# Patient Record
Sex: Male | Born: 1985 | Race: Black or African American | Hispanic: No | Marital: Married | Smoking: Never smoker
Health system: Southern US, Community
[De-identification: ages and names within clinical notes are randomized; demographics above are authoritative.]

## PROBLEM LIST (undated history)

## (undated) DIAGNOSIS — E119 Type 2 diabetes mellitus without complications: Secondary | ICD-10-CM

## (undated) DIAGNOSIS — G4730 Sleep apnea, unspecified: Secondary | ICD-10-CM

## (undated) DIAGNOSIS — E785 Hyperlipidemia, unspecified: Secondary | ICD-10-CM

## (undated) HISTORY — DX: Type 2 diabetes mellitus without complications: E11.9

## (undated) HISTORY — DX: Sleep apnea, unspecified: G47.30

## (undated) HISTORY — DX: Hyperlipidemia, unspecified: E78.5

---

## 2014-09-12 HISTORY — PX: ANKLE FRACTURE SURGERY: SHX122

## 2017-03-04 ENCOUNTER — Encounter (HOSPITAL_COMMUNITY): Payer: Self-pay | Admitting: *Deleted

## 2017-03-04 ENCOUNTER — Emergency Department (HOSPITAL_COMMUNITY): Payer: Self-pay

## 2017-03-04 ENCOUNTER — Emergency Department (HOSPITAL_COMMUNITY)
Admission: EM | Admit: 2017-03-04 | Discharge: 2017-03-04 | Disposition: A | Discharge status: Home / Self Care | Payer: Self-pay | Attending: Emergency Medicine | Admitting: Emergency Medicine

## 2017-03-04 DIAGNOSIS — Y990 Civilian activity done for income or pay: Secondary | ICD-10-CM | POA: Insufficient documentation

## 2017-03-04 DIAGNOSIS — S62342A Nondisplaced fracture of base of third metacarpal bone, right hand, initial encounter for closed fracture: Secondary | ICD-10-CM | POA: Insufficient documentation

## 2017-03-04 DIAGNOSIS — Y9389 Activity, other specified: Secondary | ICD-10-CM | POA: Insufficient documentation

## 2017-03-04 DIAGNOSIS — Y9229 Other specified public building as the place of occurrence of the external cause: Secondary | ICD-10-CM | POA: Insufficient documentation

## 2017-03-04 MED ORDER — HYDROCODONE-ACETAMINOPHEN 5-325 MG PO TABS: 1.0000 | ORAL_TABLET | Freq: Four times a day (QID) | ORAL | 0 refills | Status: DC | PRN
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
## 2017-03-04 NOTE — Progress Notes (Signed)
Orthopedic Tech Progress Note Patient Details:  Brian Dickson 06/11/1986 [id number]  Ortho Devices Type of Ortho Device: Ace wrap, Volar splint Ortho Device/Splint Location: RUE Ortho Device/Splint Interventions: Ordered, Application   Jennye MoccasinHughes, [name] Craig 03/04/2017, 4:53 PM
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
## 2017-03-04 NOTE — Discharge Instructions (Signed)
Norco as needed for pain. Ice affected area (see instructions below).  Please call the orthopedic physician listed today or first thing in the morning to schedule a follow up appointment.   Fractures generally take 4-6 weeks to heal. It is very important to keep your splint dry until your follow up with the orthopedic doctor and a cast can be applied. You may place a plastic bag around the extremity with the splint while bathing to keep it dry. Also try to sleep with the extremity elevated for the next several nights to decrease swelling. Check the fingertips and toes several times per day to make sure they are not cold, pale, or blue. If this is the case, the splint may be too tight and should return to the ER, your regular doctor or the orthopedist for recheck. Return to the ER for new or worsening symptoms, any additional concerns.   COLD THERAPY DIRECTIONS:  Ice or gel packs can be used to reduce both pain and swelling. Ice is the most helpful within the first 24 to 48 hours after an injury or flareup from overusing a muscle or joint.  Ice is effective, has very few side effects, and is safe for most people to use.   If you expose your skin to cold temperatures for too long or without the proper protection, you can damage your skin or nerves. Watch for signs of skin damage due to cold.   HOME CARE INSTRUCTIONS  Follow these tips to use ice and cold packs safely.  Place a dry or damp towel between the ice and skin. A damp towel will cool the skin more quickly, so you may need to shorten the time that the ice is used.  For a more rapid response, add gentle compression to the ice.  Ice for no more than 10 to 20 minutes at a time. The bonier the area you are icing, the less time it will take to get the benefits of ice.  Check your skin after 5 minutes to make sure there are no signs of a poor response to cold or skin damage.  Rest 20 minutes or more in between uses.  Once your skin is numb, you can  end your treatment. You can test numbness by very lightly touching your skin. The touch should be so light that you do not see the skin dimple from the pressure of your fingertip. When using ice, most people will feel these normal sensations in this order: cold, burning, aching, and numbness.

## 2017-03-08 DIAGNOSIS — M25649 Stiffness of unspecified hand, not elsewhere classified: Secondary | ICD-10-CM | POA: Insufficient documentation

## 2017-03-08 DIAGNOSIS — M79644 Pain in right finger(s): Secondary | ICD-10-CM | POA: Insufficient documentation

## 2018-01-19 ENCOUNTER — Ambulatory Visit: Payer: BLUE CROSS/BLUE SHIELD | Admitting: Medical

## 2018-01-19 ENCOUNTER — Encounter: Payer: Self-pay | Admitting: Medical

## 2018-01-19 VITALS — BP 130/86 | HR 81 | Temp 98.1°F | Ht 69.5 in | Wt 263.4 lb

## 2018-01-19 DIAGNOSIS — E669 Obesity, unspecified: Secondary | ICD-10-CM | POA: Diagnosis not present

## 2018-01-19 DIAGNOSIS — R0683 Snoring: Secondary | ICD-10-CM | POA: Diagnosis not present

## 2018-01-19 NOTE — Progress Notes (Signed)
Subjective: Chief Complaint  Patient presents with  . New Patient (Initial Visit)  . Snoring    GET LOUDER OVER THE YEARS   Here as a new patient.   Wife wanted him to get checked out.    Wife says he snores loud and he has a recording with him that verifies loud snoring with a pause in the middle for about 5 seconds.    For the most part awakes rested.  No specific daytime somnolence.  No witnessed apnea.      Exercise - sometimes.  Drives tow truck.    No family hx/o sleep apnea.    No past medical history on file.   No current outpatient medications on file prior to visit.   No current facility-administered medications on file prior to visit.    Family History  Problem Relation Age of Onset  . Heart disease Mother        WPW  . Heart disease Maternal Grandmother    ROS as in subjective    Objective: BP 130/86   Pulse 81   Temp 98.1 F (36.7 C) (Oral)   Ht 5' 9.5" (1.765 m)   Wt 263 lb 6.4 oz (119.5 kg)   SpO2 97%   BMI 38.34 kg/m   General appearance: alert, no distress, WD/WN, obese AA male HEENT: normocephalic, sclerae anicteric, TMs pearly, nares patent, no discharge or erythema, pharynx with 2 + tonsils, somewhat small airway Oral cavity: MMM, no lesions Neck: supple, no lymphadenopathy, no thyromegaly, no masses Heart: RRR, normal S1, S2, no murmurs Lungs: CTA bilaterally, no wheezes, rhonchi, or rales Pulses: 2+ symmetric, upper and lower extremities, normal cap refill Ext: no edema   Assessment: Encounter Diagnoses  Name Primary?  . Loud snoring Yes  . Class 2 obesity in adult, unspecified BMI, unspecified obesity type, unspecified whether serious comorbidity present      Plan: Discuss symptoms. Counseled on diet, exercise, measures to lose weight.   Consider sleep study.  He will consult with wife on doing sleep study.    Brian Dickson was seen today for new patient (initial visit) and snoring.  Diagnoses and all orders for this visit:  Loud  snoring  Class 2 obesity in adult, unspecified BMI, unspecified obesity type, unspecified whether serious comorbidity present

## 2018-02-02 ENCOUNTER — Other Ambulatory Visit: Payer: Self-pay

## 2018-02-02 ENCOUNTER — Telehealth: Payer: Self-pay | Admitting: Medical

## 2018-02-02 DIAGNOSIS — R0683 Snoring: Secondary | ICD-10-CM

## 2018-02-02 NOTE — Telephone Encounter (Signed)
Please refer to [facility] for sleep study.

## 2018-02-02 NOTE — Telephone Encounter (Signed)
pts wife called and states that the insurance will cover the sleep study that was requested if that could be set up he can be reached at [phone]

## 2018-02-02 NOTE — Telephone Encounter (Signed)
Sleep study ordered

## 2018-02-23 ENCOUNTER — Encounter: Payer: Self-pay | Admitting: Medical

## 2018-03-20 ENCOUNTER — Ambulatory Visit (HOSPITAL_BASED_OUTPATIENT_CLINIC_OR_DEPARTMENT_OTHER): Payer: BLUE CROSS/BLUE SHIELD | Attending: Medical | Admitting: Internal Medicine

## 2018-03-20 DIAGNOSIS — R0683 Snoring: Secondary | ICD-10-CM | POA: Diagnosis not present

## 2018-03-20 DIAGNOSIS — G4733 Obstructive sleep apnea (adult) (pediatric): Secondary | ICD-10-CM | POA: Diagnosis not present

## 2018-03-21 ENCOUNTER — Other Ambulatory Visit (HOSPITAL_BASED_OUTPATIENT_CLINIC_OR_DEPARTMENT_OTHER): Payer: Self-pay

## 2018-03-21 DIAGNOSIS — R0683 Snoring: Secondary | ICD-10-CM

## 2018-03-23 DIAGNOSIS — R0683 Snoring: Secondary | ICD-10-CM | POA: Diagnosis not present

## 2018-03-23 NOTE — Procedures (Signed)
    Patient Name: Brian Dickson, Brian Dickson Study Date: 03/20/2018 Gender: Male D.O.B: 05/18/1986 Age (years): 32 Referring Provider: Crosby Oysteravid Tysinger Height (inches): 71 Interpreting Physician: Jetty Duhamellinton Katerin Negrete MD, ABSM Weight (lbs): 263 RPSGT: [place], Brian Dickson BMI: 37 MRN: [id number] Neck Size: 17.00  CLINICAL INFORMATION Sleep Study Type: HST  Indication for sleep study: Snoring (786.09)  Epworth Sleepiness Score: 3  SLEEP STUDY TECHNIQUE A multi-channel overnight portable sleep study was performed. The channels recorded were: nasal airflow, thoracic respiratory movement, and oxygen saturation with a pulse oximetry. Snoring was also monitored.  MEDICATIONS Patient self administered medications include: none reported.  SLEEP ARCHITECTURE Patient was studied for 437.7 minutes. The sleep efficiency was 100.0 % and the patient was supine for 22%. The arousal index was 0.0 per hour.  RESPIRATORY PARAMETERS The overall AHI was 26.7 per hour, with a central apnea index of 0.0 per hour.  The oxygen nadir was 84% during sleep.  CARDIAC DATA Mean heart rate during sleep was 64.3 bpm.  IMPRESSIONS - Moderate obstructive sleep apnea occurred during this study (AHI = 26.7/h). - No significant central sleep apnea occurred during this study (CAI = 0.0/h). - Moderate oxygen desaturation was noted during this study (Min O2 = 84%). - Patient snored.  DIAGNOSIS - Obstructive Sleep Apnea (327.23 [G47.33 ICD-10])  RECOMMENDATIONS - Suggest CPAP titration sleep study or DME autopap. Other options may be appropriate, based on clinical judgment. - Positional therapy avoiding supine position during sleep. - Be careful with alcohol, sedatives and other CNS depressants that may worsen sleep apnea and disrupt normal sleep architecture. - Sleep hygiene should be reviewed to assess factors that may improve sleep quality. - Weight management and regular exercise should be initiated or  continued.  [Electronically signed] 03/23/2018 01:56 PM  Jetty Duhamellinton [name] MD, ABSM Diplomate, American Board of Sleep Medicine   NPI: [id number]                         Jetty Duhamellinton [name] Diplomate, American Board of Sleep Medicine  ELECTRONICALLY SIGNED ON:  03/23/2018, 1:59 PM [facility] PH: [phone]   FX: [phone] ACCREDITED BY THE AMERICAN ACADEMY OF SLEEP MEDICINE

## 2018-03-28 ENCOUNTER — Telehealth: Payer: Self-pay | Admitting: Medical

## 2018-03-28 NOTE — Telephone Encounter (Signed)
Yes, and per note in computer, we asked him to come in for discussion of abnormal results.   It was my impression that he is scheduled for appt to discuss.    If needed, get him in sooner.

## 2018-03-28 NOTE — Telephone Encounter (Signed)
Pt called wanting to know if Vincenza HewsShane has reviewed his recent sleep study results because they were told that the results would be sent to [facility] through Epic last week or by 03/26/18 at the latest.

## 2018-03-29 NOTE — Telephone Encounter (Signed)
Called pt and advised that sleep study results are here and Brian Dickson will be discussing them at his CPE appt that he previously scheduled for 04/03/18. Pt understood.

## 2018-04-03 ENCOUNTER — Ambulatory Visit: Payer: BLUE CROSS/BLUE SHIELD | Admitting: Medical

## 2018-04-03 ENCOUNTER — Encounter: Payer: Self-pay | Admitting: Medical

## 2018-04-03 VITALS — BP 130/80 | HR 86 | Temp 98.3°F | Resp 16 | Ht 70.5 in | Wt 261.6 lb

## 2018-04-03 DIAGNOSIS — Z23 Encounter for immunization: Secondary | ICD-10-CM | POA: Diagnosis not present

## 2018-04-03 DIAGNOSIS — Z0000 Encounter for general adult medical examination without abnormal findings: Secondary | ICD-10-CM | POA: Diagnosis not present

## 2018-04-03 DIAGNOSIS — Z7189 Other specified counseling: Secondary | ICD-10-CM

## 2018-04-03 DIAGNOSIS — G4730 Sleep apnea, unspecified: Secondary | ICD-10-CM | POA: Diagnosis not present

## 2018-04-03 DIAGNOSIS — Z7185 Encounter for immunization safety counseling: Secondary | ICD-10-CM | POA: Insufficient documentation

## 2018-04-03 LAB — POCT URINALYSIS DIP (PROADVANTAGE DEVICE)
BILIRUBIN UA: NEGATIVE mg/dL
Bilirubin, UA: NEGATIVE
Blood, UA: NEGATIVE
GLUCOSE UA: NEGATIVE mg/dL
Nitrite, UA: NEGATIVE
PROTEIN UA: NEGATIVE mg/dL
Specific Gravity, Urine: 1.02
Urobilinogen, Ur: 3.5
pH, UA: 6 (ref 5.0–8.0)

## 2018-04-03 NOTE — Patient Instructions (Signed)
Thanks for trusting [place] with your health care and for coming in for a physical today.  Below are some general recommendations I have for you:  Yearly screenings See your eye doctor yearly for routine vision care. See your dentist yearly for routine dental care including hygiene visits twice yearly. See me here yearly for a routine physical and preventative care visit   Specific Concerns today:   Regarding sleep apnea: I recommend exercising most days of the week using a type of exercise that they would enjoy and stick to such as walking, running, swimming, hiking, biking, aerobics, etc.  I recommend a healthy diet.    Do's:  whole grains such as whole grain pasta, rice, whole grains breads and whole grain cereals.  Use small quantities such as 1/2 cup per serving or 2 slices of bread per serving.   Eat 3-5 fruits daily Eat beans at least once daily Eat almonds in small quantities at least 3 days per week   If they eat meat, I recommend small portions of lean meats such as chicken, fish, and [place]. Eat as much NON corn and NON potato vegetables as they like, particularly raw or steamed Drink several large glasses of water daily  Cautions: Limit red meat Limit corn and potatoes Limit sweets, cake, pie, candy Limit beer and alcohol Avoid fried food, fast food, large portions Avoid sugary drinks such as regular soda and sweet tea   Sleep Apnea Sleep apnea is a condition that affects breathing. People with sleep apnea have moments during sleep when their breathing pauses briefly or gets shallow. Sleep apnea can cause these symptoms:  Trouble staying asleep.  Sleepiness or tiredness during the day.  Irritability.  Loud snoring.  Morning headaches.  Trouble concentrating.  Forgetting things.  Less interest in sex.  Being sleepy for no reason.  Mood swings.  Personality changes.  Depression.  Waking up a lot during the night to pee (urinate).  Dry mouth.  Sore  throat.  Follow these instructions at home:  Make any changes in your routine that your doctor recommends.  Eat a healthy, well-balanced diet.  Take over-the-counter and prescription medicines only as told by your doctor.  Avoid using alcohol, calming medicines (sedatives), and narcotic medicines.  Take steps to lose weight if you are overweight.  If you were given a machine (device) to use while you sleep, use it only as told by your doctor.  Do not use any tobacco products, such as cigarettes, chewing tobacco, and e-cigarettes. If you need help quitting, ask your doctor.  Keep all follow-up visits as told by your doctor. This is important. Contact a doctor if:  The machine that you were given to use during sleep is uncomfortable or does not seem to be working.  Your symptoms do not get better.  Your symptoms get worse. Get help right away if:  Your chest hurts.  You have trouble breathing in enough air (shortness of breath).  You have an uncomfortable feeling in your back, arms, or stomach.  You have trouble talking.  One side of your body feels weak.  A part of your face is hanging down (drooping). These symptoms may be an emergency. Do not wait to see if the symptoms will go away. Get medical help right away. Call your local emergency services (911 in the [place] Do not drive yourself to the hospital. This information is not intended to replace advice given to you by your health care provider. Make sure you  discuss any questions you have with your health care provider. Document Released: 06/07/2008 Document Revised: 04/24/2016 Document Reviewed: 06/08/2015 Elsevier Interactive Patient Education  2018 [employer]. Please follow up yearly for a physical.   Preventative Care for Adults - Male      MAINTAIN REGULAR HEALTH EXAMS:  A routine yearly physical is a good way to check in with your primary care provider about your health and preventive screening. It is also  an opportunity to share updates about your health and any concerns you have, and receive a thorough all-over exam.   Most health insurance companies pay for at least some preventative services.  Check with your health plan for specific coverages.  WHAT PREVENTATIVE SERVICES DO WOMEN NEED?  Adult men should have their weight and blood pressure checked regularly.   Men age 30 and older should have their cholesterol levels checked regularly.  Beginning at age 14 and continuing to age 50, men should be screened for colorectal cancer.  Certain people may need continued testing until age 51.  Updating vaccinations is part of preventative care.  Vaccinations help protect against diseases such as the flu.  Osteoporosis is a disease in which the bones lose minerals and strength as we age. Men ages 66 and over should discuss this with their caregivers  Lab tests are generally done as part of preventative care to screen for anemia and blood disorders, to screen for problems with the kidneys and liver, to screen for bladder problems, to check blood sugar, and to check your cholesterol level.  Preventative services generally include counseling about diet, exercise, avoiding tobacco, drugs, excessive alcohol consumption, and sexually transmitted infections.    GENERAL RECOMMENDATIONS FOR GOOD HEALTH:  Healthy diet:  Eat a variety of foods, including fruit, vegetables, animal or vegetable protein, such as meat, fish, chicken, and eggs, or beans, lentils, tofu, and grains, such as rice.  Drink plenty of water daily.  Decrease saturated fat in the diet, avoid lots of red meat, processed foods, sweets, fast foods, and fried foods.  Exercise:  Aerobic exercise helps maintain good heart health. At least 30-40 minutes of moderate-intensity exercise is recommended. For example, a brisk walk that increases your heart rate and breathing. This should be done on most days of the week.   Find a type of  exercise or a variety of exercises that you enjoy so that it becomes a part of your daily life.  Examples are running, walking, swimming, water aerobics, and biking.  For motivation and support, explore group exercise such as aerobic class, spin class, Zumba, Yoga,or  martial arts, etc.    Set exercise goals for yourself, such as a certain weight goal, walk or run in a race such as a 5k walk/run.  Speak to your primary care provider about exercise goals.  Disease prevention:  If you smoke or chew tobacco, find out from your caregiver how to quit. It can literally save your life, no matter how long you have been a tobacco user. If you do not use tobacco, never begin.   Maintain a healthy diet and normal weight. Increased weight leads to problems with blood pressure and diabetes.   The Body Mass Index or BMI is a way of measuring how much of your body is fat. Having a BMI above 27 increases the risk of heart disease, diabetes, hypertension, stroke and other problems related to obesity. Your caregiver can help determine your BMI and based on it develop an exercise and dietary  program to help you achieve or maintain this important measurement at a healthful level.  High blood pressure causes heart and blood vessel problems.  Persistent high blood pressure should be treated with medicine if weight loss and exercise do not work.   Fat and cholesterol leaves deposits in your arteries that can block them. This causes heart disease and vessel disease elsewhere in your body.  If your cholesterol is found to be high, or if you have heart disease or certain other medical conditions, then you may need to have your cholesterol monitored frequently and be treated with medication.   Ask if you should have a cardiac stress test if your history suggests this. A stress test is a test done on a treadmill that looks for heart disease. This test can find disease prior to there being a problem.  Osteoporosis is a disease  in which the bones lose minerals and strength as we age. This can result in serious bone fractures. Risk of osteoporosis can be identified using a bone density scan. Men ages 58 and over should discuss this with their caregivers. Ask your caregiver whether you should be taking a calcium supplement and Vitamin D, to reduce the rate of osteoporosis.   Avoid drinking alcohol in excess (more than two drinks per day).  Avoid use of street drugs. Do not share needles with anyone. Ask for professional help if you need assistance or instructions on stopping the use of alcohol, cigarettes, and/or drugs.  Brush your teeth twice a day with fluoride toothpaste, and floss once a day. Good oral hygiene prevents tooth decay and gum disease. The problems can be painful, unattractive, and can cause other health problems. Visit your dentist for a routine oral and dental check up and preventive care every 6-12 months.   Look at your skin regularly.  Use a mirror to look at your back. Notify your caregivers of changes in moles, especially if there are changes in shapes, colors, a size larger than a pencil eraser, an irregular border, or development of new moles.  Safety:  Use seatbelts 100% of the time, whether driving or as a passenger.  Use safety devices such as hearing protection if you work in environments with loud noise or significant background noise.  Use safety glasses when doing any work that could send debris in to the eyes.  Use a helmet if you ride a bike or motorcycle.  Use appropriate safety gear for contact sports.  Talk to your caregiver about gun safety.  Use sunscreen with a SPF (or skin protection factor) of 15 or greater.  Lighter skinned people are at a greater risk of skin cancer. Don't forget to also wear sunglasses in order to protect your eyes from too much damaging sunlight. Damaging sunlight can accelerate cataract formation.   Practice safe sex. Use condoms. Condoms are used for birth control  and to help reduce the spread of sexually transmitted infections (or STIs).  Some of the STIs are gonorrhea (the clap), chlamydia, syphilis, trichomonas, herpes, HPV (human papilloma virus) and HIV (human immunodeficiency virus) which causes AIDS. The herpes, HIV and HPV are viral illnesses that have no cure. These can result in disability, cancer and death.   Keep carbon monoxide and smoke detectors in your home functioning at all times. Change the batteries every 6 months or use a model that plugs into the wall.   Vaccinations:  Stay up to date with your tetanus shots and other required immunizations. You should have  a booster for tetanus every 10 years. Be sure to get your flu shot every year, since 5%-20% of the [place] population comes down with the flu. The flu vaccine changes each year, so being vaccinated once is not enough. Get your shot in the fall, before the flu season peaks.   Other vaccines to consider:  Human Papilloma Virus or HPV causes cancer of the cervix, and other infections that can be transmitted from person to person. There is a vaccine for HPV, and males should get immunized between the ages of 5111 and 2526. It requires a series of 3 shots.   Pneumococcal vaccine to protect against certain types of pneumonia.  This is normally recommended for adults age 32 or older.  However, adults younger than 32 years old with certain underlying conditions such as diabetes, heart or lung disease should also receive the vaccine.  Shingles vaccine to protect against Varicella Zoster if you are older than age 32, or younger than 32 years old with certain underlying illness.  If you have not had the Shingrix vaccine, please call your insurer to inquire about coverage for the Shingrix vaccine given in 2 doses.   Some insurers cover this vaccine after age 32, some cover this after age 32.  If your insurer covers this, then call to schedule appointment to have this vaccine here  Hepatitis A vaccine to  protect against a form of infection of the liver by a virus acquired from food.  Hepatitis B vaccine to protect against a form of infection of the liver by a virus acquired from blood or body fluids, particularly if you work in health care.  If you plan to travel internationally, check with your local health department for specific vaccination recommendations.   What should I know about cancer screening? Many types of cancers can be detected early and may often be prevented. Lung Cancer  You should be screened every year for lung cancer if: ? You are a current smoker who has smoked for at least 30 years. ? You are a former smoker who has quit within the past 15 years.  Talk to your health care provider about your screening options, when you should start screening, and how often you should be screened.  Colorectal Cancer  Routine colorectal cancer screening usually begins at 32 years of age and should be repeated every 5-10 years until you are 32 years old. You may need to be screened more often if early forms of precancerous polyps or small growths are found. Your health care provider may recommend screening at an earlier age if you have risk factors for colon cancer.  Your health care provider may recommend using home test kits to check for hidden blood in the stool.  A small camera at the end of a tube can be used to examine your colon (sigmoidoscopy or colonoscopy). This checks for the earliest forms of colorectal cancer.  Prostate and Testicular Cancer  Depending on your age and overall health, your health care provider may do certain tests to screen for prostate and testicular cancer.  Talk to your health care provider about any symptoms or concerns you have about testicular or prostate cancer.  Skin Cancer  Check your skin from head to toe regularly.  Tell your health care provider about any new moles or changes in moles, especially if: ? There is a change in a mole's size,  shape, or color. ? You have a mole that is larger than a pencil eraser.  Always use sunscreen. Apply sunscreen liberally and repeat throughout the day.  Protect yourself by wearing long sleeves, pants, a wide-brimmed hat, and sunglasses when outside.
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
## 2018-04-03 NOTE — Addendum Note (Signed)
Addended by: Derinda LateLAMPART, Shyteria Lewis G on: 04/03/2018 09:25 AM   Modules accepted: Orders

## 2018-04-04 LAB — COMPREHENSIVE METABOLIC PANEL
ALBUMIN: 4.6 g/dL (ref 3.5–5.5)
ALT: 32 IU/L (ref 0–44)
AST: 30 IU/L (ref 0–40)
Albumin/Globulin Ratio: 1.5 (ref 1.2–2.2)
Alkaline Phosphatase: 55 IU/L (ref 39–117)
BILIRUBIN TOTAL: 0.3 mg/dL (ref 0.0–1.2)
BUN / CREAT RATIO: 13 (ref 9–20)
BUN: 15 mg/dL (ref 6–20)
CALCIUM: 9.6 mg/dL (ref 8.7–10.2)
CO2: 23 mmol/L (ref 20–29)
Chloride: 97 mmol/L (ref 96–106)
Creatinine, Ser: 1.15 mg/dL (ref 0.76–1.27)
GFR, EST AFRICAN AMERICAN: 97 mL/min/{1.73_m2} (ref >59)
GFR, EST NON AFRICAN AMERICAN: 84 mL/min/{1.73_m2} (ref >59)
GLUCOSE: 115 mg/dL — AB (ref 65–99)
Globulin, Total: 3.1 g/dL (ref 1.5–4.5)
Potassium: 4.6 mmol/L (ref 3.5–5.2)
Sodium: 136 mmol/L (ref 134–144)
TOTAL PROTEIN: 7.7 g/dL (ref 6.0–8.5)

## 2018-04-04 LAB — LIPID PANEL
CHOLESTEROL TOTAL: 193 mg/dL (ref 100–199)
Chol/HDL Ratio: 3 ratio (ref 0.0–5.0)
HDL: 65 mg/dL (ref >39)
LDL Calculated: 107 mg/dL — ABNORMAL HIGH (ref 0–99)
Triglycerides: 106 mg/dL (ref 0–149)
VLDL Cholesterol Cal: 21 mg/dL (ref 5–40)

## 2018-04-04 LAB — CBC
Hematocrit: 46 % (ref 37.5–51.0)
Hemoglobin: 14.3 g/dL (ref 13.0–17.7)
MCH: 23.8 pg — AB (ref 26.6–33.0)
MCHC: 31.1 g/dL — AB (ref 31.5–35.7)
MCV: 76 fL — ABNORMAL LOW (ref 79–97)
Platelets: 336 10*3/uL (ref 150–450)
RBC: 6.02 x10E6/uL — ABNORMAL HIGH (ref 4.14–5.80)
RDW: 14.9 % (ref 12.3–15.4)
WBC: 7.1 10*3/uL (ref 3.4–10.8)

## 2018-04-04 LAB — TSH: TSH: 1.57 u[IU]/mL (ref 0.450–4.500)

## 2018-04-04 LAB — HEMOGLOBIN A1C
ESTIMATED AVERAGE GLUCOSE: 137 mg/dL
HEMOGLOBIN A1C: 6.4 % — AB (ref 4.8–5.6)

## 2018-04-24 ENCOUNTER — Ambulatory Visit: Payer: BLUE CROSS/BLUE SHIELD | Admitting: Medical

## 2018-04-24 ENCOUNTER — Encounter: Payer: Self-pay | Admitting: Medical

## 2018-04-24 VITALS — BP 120/78 | HR 77 | Temp 97.8°F | Ht 70.5 in | Wt 258.2 lb

## 2018-04-24 DIAGNOSIS — L259 Unspecified contact dermatitis, unspecified cause: Secondary | ICD-10-CM | POA: Diagnosis not present

## 2018-04-24 NOTE — Progress Notes (Signed)
Subjective: Chief Complaint  Patient presents with  . Eczema    forehead area    Here for bumps and dry tight skin, rash along forehead and over cheeks.  Worse on forehead.  Had similar skin issue in same area last summer.    Using Vaseline some on it.  Works outside, so doesn't use a lot of lotion on face as it sweats off.  Uses dove soap.  Every now and then wears ball cap.   He does sweat a lot and rubs his forehead with his shirt or sometimes his gloved hand at work.   Works toe truck.  Has some faint similar rash on left wrist and forearm.  No other aggravating or relieving factors. No other complaint.  No past medical history on file. No current outpatient medications on file prior to visit.   No current facility-administered medications on file prior to visit.    Ros as in subjective   Objective: BP 120/78   Pulse 77   Temp 97.8 F (36.6 C) (Oral)   Ht 5' 10.5" (1.791 m)   Wt 258 lb 3.2 oz (117.1 kg)   SpO2 96%   BMI 36.52 kg/m   Gen: wd, wn, nad, AA male Forehead and malar area with irritated skin, maculopapular rash diffuse, worse or forehead, no erythema, no vesicular lesions, no pustules Left forearm and wrist lateral and anterior surfaces with similar maculopapular rash.   There is one area over wrist somewhat more circular    Assessment: Encounter Diagnosis  Name Primary?  . Contact dermatitis, unspecified contact dermatitis type, unspecified trigger Yes     Plan: Doubt fungal on wrist, but rash in general seems more related to contact dermatitis.     Patient Instructions  Contact Dermatitis  Recommendations  Use the sample of Eucrisa cream once daily for the next 5-7 days and lets see if this calms down the forehead rash.  If this isn't helping by the end of the week, then either use over the counter Hydrocortisone cream (Cortaid) or Aquaphor lotion or Cetaphil lotion over the counter as options  Try to use a dedicated towel for wiping sweat such as  handkerchief or other clean towel instead of a gloved hand or sweaty shirt   For this week, at bedtime use Benadryl or other allergy tablet such as Zyrtec until rash resolves      Brian Dickson was seen today for eczema.  Diagnoses and all orders for this visit:  Contact dermatitis, unspecified contact dermatitis type, unspecified trigger

## 2018-04-24 NOTE — Patient Instructions (Signed)
Contact Dermatitis  Recommendations  Use the sample of Eucrisa cream once daily for the next 5-7 days and lets see if this calms down the forehead rash.  If this isn't helping by the end of the week, then either use over the counter Hydrocortisone cream (Cortaid) or Aquaphor lotion or Cetaphil lotion over the counter as options  Try to use a dedicated towel for wiping sweat such as handkerchief or other clean towel instead of a gloved hand or sweaty shirt   For this week, at bedtime use Benadryl or other allergy tablet such as Zyrtec until rash resolves

## 2018-07-25 DIAGNOSIS — Z3141 Encounter for fertility testing: Secondary | ICD-10-CM | POA: Diagnosis not present
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
## 2019-07-11 DIAGNOSIS — R2232 Localized swelling, mass and lump, left upper limb: Secondary | ICD-10-CM | POA: Diagnosis not present

## 2019-07-11 DIAGNOSIS — N6002 Solitary cyst of left breast: Secondary | ICD-10-CM | POA: Diagnosis not present

## 2019-08-29 DIAGNOSIS — Z3189 Encounter for other procreative management: Secondary | ICD-10-CM | POA: Diagnosis not present

## 2019-09-26 DIAGNOSIS — Z3189 Encounter for other procreative management: Secondary | ICD-10-CM | POA: Diagnosis not present

## 2019-10-04 ENCOUNTER — Encounter: Payer: BLUE CROSS/BLUE SHIELD | Admitting: Medical

## 2019-10-07 ENCOUNTER — Encounter: Payer: Self-pay | Admitting: Medical

## 2019-10-23 DIAGNOSIS — Z3189 Encounter for other procreative management: Secondary | ICD-10-CM | POA: Diagnosis not present

## 2019-11-14 ENCOUNTER — Telehealth: Payer: Self-pay

## 2019-11-14 ENCOUNTER — Encounter: Payer: BC Managed Care – PPO | Admitting: Medical

## 2019-11-14 ENCOUNTER — Telehealth: Payer: Self-pay | Admitting: Family Medicine

## 2019-11-14 DIAGNOSIS — Z0000 Encounter for general adult medical examination without abnormal findings: Secondary | ICD-10-CM

## 2019-11-14 NOTE — Telephone Encounter (Signed)
Pt called and states wife was supposed to call and cancel his appointment today.  I explained he no showed in January also.  And that there would be a $50.00 fee for no show.  He stated did not want to reschedule right now.  I asked that he call back to make another appointment for good care.

## 2019-11-14 NOTE — Telephone Encounter (Signed)
Lmom for patient to call the office for message from provider °

## 2019-11-14 NOTE — Telephone Encounter (Signed)
Shauna-please send letter and no-show charge   Brian Dickson or Shauna: Please call to inquire about the no-show, make sure he is aware about the above policy and charge.    He is past due for follow-up in general.  Last visit 2019.  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  Unless he has lost considerable weight since last visit, the treatment necessary would be CPAP device.  If he has not lost weight and not using CPAP, his certification would be disqualified until he is used his CPAP showing 70% or better use for least a month.  This is a requirement by the [employer].    I wanted to make him aware of this, so there were no surprises.

## 2019-11-14 NOTE — Telephone Encounter (Signed)
Blake Divine out today.  Genera told me about this after the call. Pt called to say wife was supposed to cancel appt.  Pt no showed in Jan also.  I did advised pt he need to reschedule so you could follow up on previous concerns.

## 2019-11-14 NOTE — Telephone Encounter (Signed)

## 2019-12-05 ENCOUNTER — Ambulatory Visit: Payer: Self-pay | Attending: Internal Medicine

## 2019-12-05 DIAGNOSIS — Z23 Encounter for immunization: Secondary | ICD-10-CM

## 2019-12-05 NOTE — Progress Notes (Signed)
   Covid-19 Vaccination Clinic  Name:  Brian Dickson    MRN: [id number] DOB: November 08, 1985  12/05/2019  [name] was observed post Covid-19 immunization for 15 minutes without incident. He was provided with Vaccine Information Sheet and instruction to access the V-Safe system.   [name] was instructed to call 911 with any severe reactions post vaccine: Marland Kitchen Difficulty breathing  . Swelling of face and throat  . A fast heartbeat  . A bad rash all over body  . Dizziness and weakness   Immunizations Administered    Name Date Dose VIS Date Route   Pfizer COVID-19 Vaccine 12/05/2019 11:53 AM 0.3 mL 08/23/2019 Intramuscular   Manufacturer: [employer], [employer]   Lot: [id number]   NDC: [address]-7     2

## 2019-12-31 ENCOUNTER — Ambulatory Visit: Payer: Self-pay

## 2019-12-31 ENCOUNTER — Ambulatory Visit: Payer: Self-pay | Attending: Internal Medicine

## 2019-12-31 DIAGNOSIS — Z23 Encounter for immunization: Secondary | ICD-10-CM

## 2019-12-31 NOTE — Progress Notes (Signed)
   Covid-19 Vaccination Clinic  Name:  Lynwood Kubisiak    MRN: [id number] DOB: 02/26/1986  12/31/2019  Mr. Riva was observed post Covid-19 immunization for 15 minutes without incident. He was provided with Vaccine Information Sheet and instruction to access the V-Safe system.   Mr. Welton was instructed to call 911 with any severe reactions post vaccine: Marland Kitchen Difficulty breathing  . Swelling of face and throat  . A fast heartbeat  . A bad rash all over body  . Dizziness and weakness   Immunizations Administered    Name Date Dose VIS Date Route   Pfizer COVID-19 Vaccine 12/31/2019  8:14 AM 0.3 mL 11/06/2018 Intramuscular   Manufacturer: [employer], [employer]   Lot: [id number]   NDC: [address]-4

## 2020-01-01 ENCOUNTER — Encounter: Payer: Self-pay | Admitting: Nurse Practitioner

## 2020-01-01 ENCOUNTER — Ambulatory Visit (INDEPENDENT_AMBULATORY_CARE_PROVIDER_SITE_OTHER): Payer: 59 | Admitting: Nurse Practitioner

## 2020-01-01 ENCOUNTER — Other Ambulatory Visit: Payer: Self-pay

## 2020-01-01 VITALS — BP 122/84 | HR 86 | Temp 97.6°F | Resp 18 | Ht 71.0 in | Wt 263.8 lb

## 2020-01-01 DIAGNOSIS — Z13220 Encounter for screening for lipoid disorders: Secondary | ICD-10-CM

## 2020-01-01 DIAGNOSIS — Z7689 Persons encountering health services in other specified circumstances: Secondary | ICD-10-CM | POA: Diagnosis not present

## 2020-01-01 DIAGNOSIS — Z0001 Encounter for general adult medical examination with abnormal findings: Secondary | ICD-10-CM

## 2020-01-01 DIAGNOSIS — E669 Obesity, unspecified: Secondary | ICD-10-CM

## 2020-01-01 DIAGNOSIS — Z5320 Procedure and treatment not carried out because of patient's decision for unspecified reasons: Secondary | ICD-10-CM

## 2020-01-01 DIAGNOSIS — Z0000 Encounter for general adult medical examination without abnormal findings: Secondary | ICD-10-CM

## 2020-01-01 DIAGNOSIS — Z13228 Encounter for screening for other metabolic disorders: Secondary | ICD-10-CM | POA: Diagnosis not present

## 2020-01-01 NOTE — Progress Notes (Signed)
New Patient Office Visit  Subjective:  Patient ID: Brian Dickson, male    DOB: [birth year]  Age: 34 y.o. MRN: [id number]  CC:  Chief Complaint  Patient presents with  . Establish Care    NP    HPI Brian Dickson is a 34 year old male presenting to establish care. He feels he is in overall good health. He does admit he should loose weight. Time spent discussing healthy exercise and diet, burning more calories than consumed to loose weight. He verbalized an understanding. He declined HIV screening. He is married and monogamous. No cp, ct  Tdap and Flu utd   History reviewed. No pertinent past medical history.  Past Surgical History:  Procedure Laterality Date  . ANKLE FRACTURE SURGERY  2016   right, [facility] in [place], [place]    Family History  Problem Relation Age of Onset  . Heart disease Mother        WPW  . Heart disease Maternal Grandmother   . Diabetes Maternal Grandmother   . Cancer Maternal Grandfather   . Hypertension Other   . Stroke Neg Hx     Social History   Socioeconomic History  . Marital status: Married    Spouse name: Not on file  . Number of children: Not on file  . Years of education: Not on file  . Highest education level: Not on file  Occupational History  . Not on file  Tobacco Use  . Smoking status: Never Smoker  . Smokeless tobacco: Never Used  Substance and Sexual Activity  . Alcohol use: Yes    Comment: occasion  . Drug use: No  . Sexual activity: Yes  Other Topics Concern  . 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   Some exercise, just started going to [facility].  03/2018   Social Determinants of Health   Financial Resource Strain:   . Difficulty of Paying Living Expenses:   Food Insecurity:   . Worried About Programme researcher, broadcasting/film/video in the Last Year:   . Barista in the Last Year:   Transportation Needs:   . Freight forwarder (Medical):   Marland Kitchen Lack of Transportation  (Non-Medical):   Physical Activity:   . Days of Exercise per Week:   . Minutes of Exercise per Session:   Stress:   . Feeling of Stress :   Social Connections:   . Frequency of Communication with Friends and Family:   . Frequency of Social Gatherings with Friends and Family:   . Attends Religious Services:   . Active Member of Clubs or Organizations:   . Attends Banker Meetings:   Marland Kitchen Marital Status:   Intimate Partner Violence:   . Fear of Current or Ex-Partner:   . Emotionally Abused:   Marland Kitchen Physically Abused:   . Sexually Abused:     ROS Review of Systems  All other systems reviewed and are negative.   Objective:   Today's Vitals: BP 122/84 (BP Location: Left Arm, Patient Position: Sitting, Cuff Size: Large)   Pulse 86   Temp 97.6 F (36.4 C) (Temporal)   Resp 18   Ht 5\' 11"  (1.803 m)   Wt 263 lb 12.8 oz (119.7 kg)   SpO2 97%   BMI 36.79 kg/m   Physical Exam Vitals and nursing note reviewed.  Constitutional:      Appearance: Normal appearance. He is well-developed and well-groomed.  He is not ill-appearing.  HENT:     Head: Normocephalic.     Right Ear: Hearing and external ear normal.     Left Ear: Hearing and external ear normal.     Nose: Nose normal.     Mouth/Throat:     Lips: Pink.     Mouth: Mucous membranes are moist.     Dentition: Normal dentition.     Tongue: No lesions. Tongue does not deviate from midline.     Pharynx: Oropharynx is clear.  Eyes:     General: Lids are normal. Lids are everted, no foreign bodies appreciated. No scleral icterus.    Extraocular Movements: Extraocular movements intact.     Conjunctiva/sclera: Conjunctivae normal.     Pupils: Pupils are equal, round, and reactive to light.  Neck:     Thyroid: No thyromegaly.     Vascular: No carotid bruit or JVD.  Cardiovascular:     Rate and Rhythm: Normal rate and regular rhythm.     Pulses: Normal pulses.     Heart sounds: Normal heart sounds, S1 normal and S2  normal.  Pulmonary:     Effort: Pulmonary effort is normal.     Breath sounds: Normal breath sounds.  Abdominal:     General: Abdomen is flat. Bowel sounds are normal. There is no abdominal bruit.     Palpations: Abdomen is soft. There is no hepatomegaly or splenomegaly.  Musculoskeletal:        General: Normal range of motion.     Cervical back: Full passive range of motion without pain, normal range of motion and neck supple.     Right lower leg: No edema.     Left lower leg: No edema.  Lymphadenopathy:     Cervical: No cervical adenopathy.  Skin:    General: Skin is warm and dry.     Capillary Refill: Capillary refill takes less than 2 seconds.  Neurological:     General: No focal deficit present.     Mental Status: He is alert and oriented to person, place, and time.  Psychiatric:        Attention and Perception: Attention normal.        Mood and Affect: Mood normal.        Speech: Speech normal.        Behavior: Behavior normal. Behavior is cooperative.        Thought Content: Thought content normal.        Cognition and Memory: Cognition normal.        Judgment: Judgment normal.     Assessment & Plan:   Problem List Items Addressed This Visit      Other   Routine general medical examination at a health care facility - Primary   Relevant Orders   Hemoglobin A1c   COMPLETE METABOLIC PANEL WITH GFR   CBC with Differential/Platelet   Lipid panel    Other Visit Diagnoses    Establishing care with new doctor, encounter for       Screening, lipid       Relevant Orders   Lipid panel   Screening for metabolic disorder       Relevant Orders   COMPLETE METABOLIC PANEL WITH GFR   CBC with Differential/Platelet   Obesity (BMI 35.0-39.9 without comorbidity)       Relevant Orders   Hemoglobin A1c   HIV screening declined        Established care General exam normal Labs completed today  will call with results and directions Follow lower calorie diet exercise 20  minutes at least 4-5 times per week for weight management.  Educational print out provided.  Follow up annually for well examination and as needed   Follow-up: Return in about 1 year (around 12/31/2020) for labs one week prior.   Annie Main, FNP

## 2020-01-02 ENCOUNTER — Other Ambulatory Visit: Payer: Self-pay | Admitting: Nurse Practitioner

## 2020-01-02 DIAGNOSIS — E1165 Type 2 diabetes mellitus with hyperglycemia: Secondary | ICD-10-CM

## 2020-01-02 DIAGNOSIS — E781 Pure hyperglyceridemia: Secondary | ICD-10-CM

## 2020-01-02 LAB — CBC WITH DIFFERENTIAL/PLATELET
Absolute Monocytes: 668 cells/uL (ref 200–950)
Basophils Absolute: 18 cells/uL (ref 0–200)
Basophils Relative: 0.2 %
Eosinophils Absolute: 62 cells/uL (ref 15–500)
Eosinophils Relative: 0.7 %
HCT: 45.9 % (ref 38.5–50.0)
Hemoglobin: 14.5 g/dL (ref 13.2–17.1)
Lymphs Abs: 579 cells/uL — ABNORMAL LOW (ref 850–3900)
MCH: 24.6 pg — ABNORMAL LOW (ref 27.0–33.0)
MCHC: 31.6 g/dL — ABNORMAL LOW (ref 32.0–36.0)
MCV: 77.8 fL — ABNORMAL LOW (ref 80.0–100.0)
MPV: 10.3 fL (ref 7.5–12.5)
Monocytes Relative: 7.5 %
Neutro Abs: 7574 cells/uL (ref 1500–7800)
Neutrophils Relative %: 85.1 %
Platelets: 272 10*3/uL (ref 140–400)
RBC: 5.9 10*6/uL — ABNORMAL HIGH (ref 4.20–5.80)
RDW: 13.4 % (ref 11.0–15.0)
Total Lymphocyte: 6.5 %
WBC: 8.9 10*3/uL (ref 3.8–10.8)

## 2020-01-02 LAB — LIPID PANEL
Cholesterol: 199 mg/dL (ref <200)
HDL: 63 mg/dL (ref >=40)
LDL Cholesterol (Calc): 123 mg/dL (calc) — ABNORMAL HIGH
Non-HDL Cholesterol (Calc): 136 mg/dL (calc) — ABNORMAL HIGH (ref <130)
Total CHOL/HDL Ratio: 3.2 (calc) (ref <5.0)
Triglycerides: 43 mg/dL (ref <150)

## 2020-01-02 LAB — COMPLETE METABOLIC PANEL WITH GFR
AG Ratio: 1.4 (calc) (ref 1.0–2.5)
ALT: 24 U/L (ref 9–46)
AST: 23 U/L (ref 10–40)
Albumin: 4.1 g/dL (ref 3.6–5.1)
Alkaline phosphatase (APISO): 46 U/L (ref 36–130)
BUN: 23 mg/dL (ref 7–25)
CO2: 27 mmol/L (ref 20–32)
Calcium: 9 mg/dL (ref 8.6–10.3)
Chloride: 104 mmol/L (ref 98–110)
Creat: 1.05 mg/dL (ref 0.60–1.35)
GFR, Est African American: 108 mL/min/{1.73_m2} (ref >=60)
GFR, Est Non African American: 93 mL/min/{1.73_m2} (ref >=60)
Globulin: 3 g/dL (calc) (ref 1.9–3.7)
Glucose, Bld: 105 mg/dL — ABNORMAL HIGH (ref 65–99)
Potassium: 4.2 mmol/L (ref 3.5–5.3)
Sodium: 137 mmol/L (ref 135–146)
Total Bilirubin: 0.5 mg/dL (ref 0.2–1.2)
Total Protein: 7.1 g/dL (ref 6.1–8.1)

## 2020-01-02 LAB — HEMOGLOBIN A1C
Hgb A1c MFr Bld: 6.4 % of total Hgb — ABNORMAL HIGH (ref <5.7)
Mean Plasma Glucose: 137 (calc)
eAG (mmol/L): 7.6 (calc)

## 2020-01-02 MED ORDER — METFORMIN HCL 500 MG PO TABS: 500.0000 mg | ORAL_TABLET | Freq: Two times a day (BID) | ORAL | 3 refills | Status: AC

## 2020-01-02 NOTE — Progress Notes (Signed)
Lab results: A1C: she is a Dm2 I have sent metformin to start. She should get at least 20 minutes of exercise 5 days a week and follow low carb low sugar diet  Triglycerides are elevated, lower fats and cholesterol's in diet   Lab repeated in 4 months with appt please. Labs one week prior to apt.

## 2020-04-07 ENCOUNTER — Ambulatory Visit: Payer: 59 | Admitting: Nurse Practitioner

## 2020-05-04 ENCOUNTER — Other Ambulatory Visit: Payer: Self-pay

## 2020-05-04 ENCOUNTER — Ambulatory Visit: Payer: 59 | Admitting: Nurse Practitioner

## 2020-05-04 VITALS — BP 122/84 | HR 61 | Temp 97.6°F | Resp 18 | Wt 258.8 lb

## 2020-05-04 DIAGNOSIS — E1165 Type 2 diabetes mellitus with hyperglycemia: Secondary | ICD-10-CM

## 2020-05-04 DIAGNOSIS — E785 Hyperlipidemia, unspecified: Secondary | ICD-10-CM | POA: Diagnosis not present

## 2020-05-04 DIAGNOSIS — Z1159 Encounter for screening for other viral diseases: Secondary | ICD-10-CM | POA: Diagnosis not present

## 2020-05-04 DIAGNOSIS — Z6836 Body mass index (BMI) 36.0-36.9, adult: Secondary | ICD-10-CM

## 2020-05-04 MED ORDER — ROSUVASTATIN CALCIUM 5 MG PO TABS: 5.0000 mg | ORAL_TABLET | Freq: Every day | ORAL | 3 refills | Status: DC

## 2020-05-04 NOTE — Progress Notes (Signed)
Established Patient Office Visit  Subjective:  Patient ID: Brian Dickson, male    DOB: Apr 29, 1986  Age: 34 y.o. MRN: [id number]  CC:  Chief Complaint  Patient presents with  . Follow-up    dm    HPI Brian Dickson is a 34 year old male presenting for 4 month general exam with labs.  01/01/2020 cholesterol 199, LDL 123 A1C 6.4. Taking Metformin 500 mg  Tablet two times daily with meals orally. Pt does struggle with regular exercise related to old injury with surgery to rle.   Time spent discussing diet of low carb low sugar low fat low cholesterol, 2 gram sodium daily restriction per day and he is compelled.  Pt did not complete labs prior to apt and will complete today. The results will be reviewed and any instructions, changes to treatment plan will be notified on mychart and called to pt by telephone. Discussed with DM2 and hyperlipid history adding low dose statin for added protection and pt does agree/desire.  Pt has h/o metal plate in his right lower extremity that makes walking long distances taxing for him. Today he is requesting a handicapped parking pass form be filled out form him to take to the [facility]. Discussed a form will be filled out that will need to be ongoing renewed. PT should be considered as an option to assure at optimal level of function. He is not considering today. Did discuss need for the record in order to complete the said form and pt will assure the records are transferred.   No cp, ct, gu/gi sxs, other pain, sob, edema, palpation, or recent fall/injury.  Deferred flu vaccine until season consider starting the end of September 2021. Hep C screen lab today. Urine microalb to be collected today.   Form filled out in clinic for the pt's wife's insurance wellness plan regarding the pt.    No past medical history on file.  Past Surgical History:  Procedure Laterality Date  . ANKLE FRACTURE SURGERY  2016   right, [facility] in [place], [place]    Family History    Problem Relation Age of Onset  . Heart disease Mother        WPW  . Heart disease Maternal Grandmother   . Diabetes Maternal Grandmother   . Cancer Maternal Grandfather   . Hypertension Other   . Stroke Neg Hx     Social History   Socioeconomic History  . Marital status: Married    Spouse name: Not on file  . Number of children: Not on file  . Years of education: Not on file  . Highest education level: Not on file  Occupational History  . Not on file  Tobacco Use  . Smoking status: Never Smoker  . Smokeless tobacco: Never Used  Vaping Use  . Vaping Use: Never used  Substance and Sexual Activity  . Alcohol use: Yes    Comment: occasion  . Drug use: No  . Sexual activity: Yes  Other Topics Concern  . 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   Some exercise, just started going to [facility].  03/2018   Social Determinants of Health   Financial Resource Strain:   . Difficulty of Paying Living Expenses: Not on file  Food Insecurity:   . Worried About Programme researcher, broadcasting/film/video in the Last Year: Not on file  . Ran Out of Food in the Last Year: Not on  file  Transportation Needs:   . Freight forwarder (Medical): Not on file  . Lack of Transportation (Non-Medical): Not on file  Physical Activity:   . Days of Exercise per Week: Not on file  . Minutes of Exercise per Session: Not on file  Stress:   . Feeling of Stress : Not on file  Social Connections:   . Frequency of Communication with Friends and Family: Not on file  . Frequency of Social Gatherings with Friends and Family: Not on file  . Attends Religious Services: Not on file  . Active Member of Clubs or Organizations: Not on file  . Attends Banker Meetings: Not on file  . Marital Status: Not on file  Intimate Partner Violence:   . Fear of Current or Ex-Partner: Not on file  . Emotionally Abused: Not on file  . Physically Abused: Not on file  . Sexually  Abused: Not on file    Outpatient Medications Prior to Visit  Medication Sig Dispense Refill  . metFORMIN (GLUCOPHAGE) 500 MG tablet Take 1 tablet (500 mg total) by mouth 2 (two) times daily with a meal. 180 tablet 3   No facility-administered medications prior to visit.    No Known Allergies  ROS Review of Systems  All other systems reviewed and are negative.     Objective:    Physical Exam Vitals and nursing note reviewed.  Constitutional:      Appearance: Normal appearance.  HENT:     Head: Normocephalic.  Eyes:     Extraocular Movements: Extraocular movements intact.     Conjunctiva/sclera: Conjunctivae normal.     Pupils: Pupils are equal, round, and reactive to light.  Neck:     Vascular: No carotid bruit.  Cardiovascular:     Rate and Rhythm: Normal rate and regular rhythm.     Heart sounds: Normal heart sounds.  Pulmonary:     Effort: Pulmonary effort is normal.     Breath sounds: Normal breath sounds.  Musculoskeletal:     Cervical back: Normal range of motion and neck supple.     Right lower leg: No edema.     Left lower leg: No edema.  Skin:    General: Skin is warm and dry.     Coloration: Skin is not jaundiced or pale.  Neurological:     General: No focal deficit present.     Mental Status: He is alert and oriented to person, place, and time.  Psychiatric:        Mood and Affect: Mood normal.        Behavior: Behavior normal.        Thought Content: Thought content normal.        Judgment: Judgment normal.     BP 122/84 (BP Location: Left Arm, Patient Position: Sitting, Cuff Size: Large)   Pulse 61   Temp 97.6 F (36.4 C) (Temporal)   Resp 18   Wt 258 lb 12.8 oz (117.4 kg)   SpO2 97%   BMI 36.10 kg/m  Wt Readings from Last 3 Encounters:  05/04/20 258 lb 12.8 oz (117.4 kg)  01/01/20 263 lb 12.8 oz (119.7 kg)  04/24/18 258 lb 3.2 oz (117.1 kg)     Health Maintenance Due  Topic Date Due  . URINE MICROALBUMIN  Never done    There  are no preventive care reminders to display for this patient.  Lab Results  Component Value Date   TSH 1.570 04/03/2018   Lab  Results  Component Value Date   WBC 8.9 01/01/2020   HGB 14.5 01/01/2020   HCT 45.9 01/01/2020   MCV 77.8 (L) 01/01/2020   PLT 272 01/01/2020   Lab Results  Component Value Date   NA 137 01/01/2020   K 4.2 01/01/2020   CO2 27 01/01/2020   GLUCOSE 105 (H) 01/01/2020   BUN 23 01/01/2020   CREATININE 1.05 01/01/2020   BILITOT 0.5 01/01/2020   ALKPHOS 55 04/03/2018   AST 23 01/01/2020   ALT 24 01/01/2020   PROT 7.1 01/01/2020   ALBUMIN 4.6 04/03/2018   CALCIUM 9.0 01/01/2020   Lab Results  Component Value Date   CHOL 199 01/01/2020   Lab Results  Component Value Date   HDL 63 01/01/2020   Lab Results  Component Value Date   LDLCALC 123 (H) 01/01/2020   Lab Results  Component Value Date   TRIG 43 01/01/2020   Lab Results  Component Value Date   CHOLHDL 3.2 01/01/2020   Lab Results  Component Value Date   HGBA1C 6.4 (H) 01/01/2020      Assessment & Plan:   Problem List Items Addressed This Visit    None    Visit Diagnoses    Type 2 diabetes mellitus with hyperglycemia, without long-term current use of insulin (HCC)    -  Primary   Relevant Medications   rosuvastatin (CRESTOR) 5 MG tablet   Other Relevant Orders   Lipid panel   CBC with Differential/Platelet   COMPLETE METABOLIC PANEL WITH GFR   Hemoglobin A1c   Microalbumin, urine   Hyperlipidemia, unspecified hyperlipidemia type       Relevant Medications   rosuvastatin (CRESTOR) 5 MG tablet   Other Relevant Orders   Lipid panel   CBC with Differential/Platelet   COMPLETE METABOLIC PANEL WITH GFR   Hemoglobin A1c   Encounter for hepatitis C screening test for low risk patient       Relevant Orders   Hepatitis C antibody   Adult BMI 36.0-36.9 kg/sq m         DM2 labs today, fu every 6 months repeated labs, Metformin 500 mg  Tablet two times daily with meals  orally  Hyperlipidemia:  labs today, fu every 6 months repeated labs, rosuvastatin low dose sent to pharmacy starting new.   +BMI 36.10 Follow a low carb, low sugar, low fat, low cholesterol, 2 gram sodium restricted daily diet, get at least 20 minutes daily of exercise at least 4 times per week.   Hep C screen lab today. Urine microalb to be collected today.  Meds ordered this encounter  Medications  . rosuvastatin (CRESTOR) 5 MG tablet    Sig: Take 1 tablet (5 mg total) by mouth daily.    Dispense:  90 tablet    Refill:  3    Follow-up: Return in about 6 months (around 11/04/2020).    Elmore Guise, FNP

## 2020-05-04 NOTE — Patient Instructions (Signed)
DM2 labs today, fu every 6 months repeated labs, Metformin 500 mg  Tablet two times daily with meals orally  Hyperlipidemia:  labs today, fu every 6 months repeated labs, rosuvastatin low dose sent to pharmacy starting new.   Follow a low carb, low sugar, low fat, low cholesterol, 2 gram sodium restricted daily diet, get at least 20 minutes daily of exercise at least 4 times per week.   Hep C screen lab today. Urine microalb to be collected today.

## 2020-05-05 ENCOUNTER — Other Ambulatory Visit: Payer: Self-pay | Admitting: Nurse Practitioner

## 2020-05-05 DIAGNOSIS — D509 Iron deficiency anemia, unspecified: Secondary | ICD-10-CM

## 2020-05-05 LAB — HEPATITIS C ANTIBODY
Hepatitis C Ab: NONREACTIVE
SIGNAL TO CUT-OFF: 0.01 (ref <1.00)

## 2020-05-05 LAB — CBC WITH DIFFERENTIAL/PLATELET
Absolute Monocytes: 603 cells/uL (ref 200–950)
Basophils Absolute: 17 cells/uL (ref 0–200)
Basophils Relative: 0.3 %
Eosinophils Absolute: 162 cells/uL (ref 15–500)
Eosinophils Relative: 2.8 %
HCT: 43.9 % (ref 38.5–50.0)
Hemoglobin: 14 g/dL (ref 13.2–17.1)
Lymphs Abs: 1276 cells/uL (ref 850–3900)
MCH: 25.3 pg — ABNORMAL LOW (ref 27.0–33.0)
MCHC: 31.9 g/dL — ABNORMAL LOW (ref 32.0–36.0)
MCV: 79.2 fL — ABNORMAL LOW (ref 80.0–100.0)
MPV: 10.7 fL (ref 7.5–12.5)
Monocytes Relative: 10.4 %
Neutro Abs: 3741 cells/uL (ref 1500–7800)
Neutrophils Relative %: 64.5 %
Platelets: 221 10*3/uL (ref 140–400)
RBC: 5.54 10*6/uL (ref 4.20–5.80)
RDW: 13.5 % (ref 11.0–15.0)
Total Lymphocyte: 22 %
WBC: 5.8 10*3/uL (ref 3.8–10.8)

## 2020-05-05 LAB — COMPLETE METABOLIC PANEL WITH GFR
AG Ratio: 1.3 (calc) (ref 1.0–2.5)
ALT: 23 U/L (ref 9–46)
AST: 23 U/L (ref 10–40)
Albumin: 3.9 g/dL (ref 3.6–5.1)
Alkaline phosphatase (APISO): 44 U/L (ref 36–130)
BUN: 15 mg/dL (ref 7–25)
CO2: 24 mmol/L (ref 20–32)
Calcium: 9 mg/dL (ref 8.6–10.3)
Chloride: 105 mmol/L (ref 98–110)
Creat: 1.13 mg/dL (ref 0.60–1.35)
GFR, Est African American: 98 mL/min/{1.73_m2} (ref >=60)
GFR, Est Non African American: 84 mL/min/{1.73_m2} (ref >=60)
Globulin: 3 g/dL (calc) (ref 1.9–3.7)
Glucose, Bld: 100 mg/dL — ABNORMAL HIGH (ref 65–99)
Potassium: 4.3 mmol/L (ref 3.5–5.3)
Sodium: 139 mmol/L (ref 135–146)
Total Bilirubin: 0.3 mg/dL (ref 0.2–1.2)
Total Protein: 6.9 g/dL (ref 6.1–8.1)

## 2020-05-05 LAB — LIPID PANEL
Cholesterol: 195 mg/dL (ref <200)
HDL: 65 mg/dL (ref >=40)
LDL Cholesterol (Calc): 110 mg/dL (calc) — ABNORMAL HIGH
Non-HDL Cholesterol (Calc): 130 mg/dL (calc) — ABNORMAL HIGH (ref <130)
Total CHOL/HDL Ratio: 3 (calc) (ref <5.0)
Triglycerides: 95 mg/dL (ref <150)

## 2020-05-05 LAB — HEMOGLOBIN A1C
Hgb A1c MFr Bld: 6.3 % of total Hgb — ABNORMAL HIGH (ref <5.7)
Mean Plasma Glucose: 134 (calc)
eAG (mmol/L): 7.4 (calc)

## 2020-05-05 LAB — MICROALBUMIN, URINE: Microalb, Ur: 0.3 mg/dL

## 2020-07-17 ENCOUNTER — Other Ambulatory Visit: Payer: Self-pay | Admitting: *Deleted

## 2020-07-17 ENCOUNTER — Telehealth: Payer: Self-pay | Admitting: Family Medicine

## 2020-07-17 DIAGNOSIS — Z111 Encounter for screening for respiratory tuberculosis: Secondary | ICD-10-CM

## 2020-07-17 NOTE — Telephone Encounter (Signed)
Would like to have a TB test

## 2020-07-17 NOTE — Telephone Encounter (Signed)
Lab orders placed.  

## 2020-07-22 ENCOUNTER — Other Ambulatory Visit: Payer: 59

## 2020-07-22 ENCOUNTER — Other Ambulatory Visit: Payer: Self-pay

## 2020-07-22 DIAGNOSIS — Z111 Encounter for screening for respiratory tuberculosis: Secondary | ICD-10-CM

## 2020-07-24 LAB — QUANTIFERON-TB GOLD PLUS
Mitogen-NIL: >10 IU/mL
NIL: 0.02 IU/mL
QuantiFERON-TB Gold Plus: NEGATIVE
TB1-NIL: 0 IU/mL
TB2-NIL: 0 IU/mL

## 2020-08-29 ENCOUNTER — Ambulatory Visit: Payer: Self-pay | Attending: Internal Medicine

## 2020-08-29 DIAGNOSIS — Z23 Encounter for immunization: Secondary | ICD-10-CM

## 2020-08-29 NOTE — Progress Notes (Signed)
   Covid-19 Vaccination Clinic  Name:  Lydia Meng    MRN: [id number] DOB: 03-Sep-1986  08/29/2020  Mr. Balboa was observed post Covid-19 immunization for 15 minutes without incident. He was provided with Vaccine Information Sheet and instruction to access the V-Safe system.   Mr. Harbor was instructed to call 911 with any severe reactions post vaccine: Marland Kitchen Difficulty breathing  . Swelling of face and throat  . A fast heartbeat  . A bad rash all over body  . Dizziness and weakness   Immunizations Administered    Name Date Dose VIS Date Route   Pfizer COVID-19 Vaccine 08/29/2020  9:39 AM 0.3 mL 07/01/2020 Intramuscular   Manufacturer: [employer], [employer]   Lot: [id number]   NDC: [address]-6

## 2020-11-05 ENCOUNTER — Ambulatory Visit (INDEPENDENT_AMBULATORY_CARE_PROVIDER_SITE_OTHER): Payer: 59 | Admitting: Family Medicine

## 2020-11-05 ENCOUNTER — Other Ambulatory Visit: Payer: Self-pay

## 2020-11-05 ENCOUNTER — Encounter: Payer: Self-pay | Admitting: Family Medicine

## 2020-11-05 VITALS — BP 124/74 | HR 74 | Temp 97.5°F | Ht 71.0 in | Wt 261.0 lb

## 2020-11-05 DIAGNOSIS — E1165 Type 2 diabetes mellitus with hyperglycemia: Secondary | ICD-10-CM | POA: Diagnosis not present

## 2020-11-05 DIAGNOSIS — E785 Hyperlipidemia, unspecified: Secondary | ICD-10-CM

## 2020-11-05 LAB — COMPLETE METABOLIC PANEL WITH GFR
AST: 19 U/L (ref 10–40)
BUN: 18 mg/dL (ref 7–25)
CO2: 29 mmol/L (ref 20–32)
Calcium: 9.5 mg/dL (ref 8.6–10.3)
Creat: 1.17 mg/dL (ref 0.60–1.35)
GFR, Est African American: 94 mL/min/{1.73_m2} (ref >=60)
Globulin: 2.6 g/dL (calc) (ref 1.9–3.7)
Total Bilirubin: 0.4 mg/dL (ref 0.2–1.2)
Total Protein: 7 g/dL (ref 6.1–8.1)

## 2020-11-05 LAB — CBC WITH DIFFERENTIAL/PLATELET
Basophils Relative: 0.3 %
Eosinophils Relative: 1.7 %
HCT: 44.8 % (ref 38.5–50.0)
Lymphs Abs: 1651 cells/uL (ref 850–3900)
MCHC: 31.7 g/dL — ABNORMAL LOW (ref 32.0–36.0)
MCV: 77.6 fL — ABNORMAL LOW (ref 80.0–100.0)
Monocytes Relative: 8.2 %
RBC: 5.77 10*6/uL (ref 4.20–5.80)

## 2020-11-05 NOTE — Progress Notes (Signed)
Subjective:    Patient ID: Brian Dickson, male    DOB: 03-02-1986, 35 y.o.   MRN: [id number]  HPI  Patient states that for the last several weeks both ears have felt stopped up.  He has been using eardrops but not having any success.  They feel like they are plugged up with water.  He denies any ear pain.  He denies any itching or burning.  He denies any dizziness.  He has a history of type 2 diabetes mellitus.  He has been off his Metformin since August.  He denies any polyuria polydipsia or blurry vision.  He denies any chest pain shortness of breath or dyspnea on exertion.  He denies any neuropathy in the feet.  Past Medical History:  Diagnosis Date  . Diabetes mellitus without complication (HCC)   . Hyperlipidemia    Past Surgical History:  Procedure Laterality Date  . ANKLE FRACTURE SURGERY  2016   right, [facility] in [place], [place]   Current Outpatient Medications on File Prior to Visit  Medication Sig Dispense Refill  . metFORMIN (GLUCOPHAGE) 500 MG tablet Take 1 tablet (500 mg total) by mouth 2 (two) times daily with a meal. 180 tablet 3  . rosuvastatin (CRESTOR) 5 MG tablet Take 1 tablet (5 mg total) by mouth daily. 90 tablet 3   No current facility-administered medications on file prior to visit.   No Known Allergies Social History   Socioeconomic History  . Marital status: Married    Spouse name: Not on file  . Number of children: Not on file  . Years of education: Not on file  . Highest education level: Not on file  Occupational History  . Not on file  Tobacco Use  . Smoking status: Never Smoker  . Smokeless tobacco: Never Used  Vaping Use  . Vaping Use: Never used  Substance and Sexual Activity  . Alcohol use: Yes    Comment: occasion  . Drug use: No  . Sexual activity: Yes  Other Topics Concern  . 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   Some exercise, just started going to [facility].  03/2018   Social  Determinants of Health   Financial Resource Strain: Not on file  Food Insecurity: Not on file  Transportation Needs: Not on file  Physical Activity: Not on file  Stress: Not on file  Social Connections: Not on file  Intimate Partner Violence: Not on file      Review of Systems  All other systems reviewed and are negative.      Objective:   Physical Exam Vitals reviewed.  Constitutional:      General: He is not in acute distress.    Appearance: Normal appearance. He is not ill-appearing, toxic-appearing or diaphoretic.  Cardiovascular:     Rate and Rhythm: Normal rate and regular rhythm.     Pulses: Normal pulses.     Heart sounds: Normal heart sounds. No murmur heard. No friction rub. No gallop.   Pulmonary:     Effort: Pulmonary effort is normal. No respiratory distress.     Breath sounds: Normal breath sounds. No wheezing, rhonchi or rales.  Abdominal:     General: Bowel sounds are normal. There is no distension.     Palpations: Abdomen is soft.     Tenderness: There is no abdominal tenderness. There is no guarding.  Musculoskeletal:     Right lower leg: No edema.  Left lower leg: No edema.  Neurological:     Mental Status: He is alert.     Patient has bilateral cerumen impactions      Assessment & Plan:  Type 2 diabetes mellitus with hyperglycemia, without long-term current use of insulin (HCC) - Plan: Hemoglobin A1c, CBC with Differential/Platelet, COMPLETE METABOLIC PANEL WITH GFR, Lipid panel, Microalbumin, urine  Hyperlipidemia, unspecified hyperlipidemia type - Plan: Hemoglobin A1c, CBC with Differential/Platelet, COMPLETE METABOLIC PANEL WITH GFR, Lipid panel, Microalbumin, urine  Cerumen impaction was cleared with irrigation and lavage without difficulty.  Blood pressures acceptable today.  Check fasting lipid panel.  Goal LDL cholesterol is less than 100.  Check A1c.  Goal A1c is less than 6.5.  Check urine microalbumin to creatinine ratio.  Goal is  less than 30.  Lab work reflects the patient being off medication for 5 months.

## 2020-11-06 LAB — CBC WITH DIFFERENTIAL/PLATELET
Absolute Monocytes: 533 cells/uL (ref 200–950)
Basophils Absolute: 20 cells/uL (ref 0–200)
Eosinophils Absolute: 111 cells/uL (ref 15–500)
Hemoglobin: 14.2 g/dL (ref 13.2–17.1)
MCH: 24.6 pg — ABNORMAL LOW (ref 27.0–33.0)
MPV: 10.6 fL (ref 7.5–12.5)
Neutro Abs: 4186 cells/uL (ref 1500–7800)
Neutrophils Relative %: 64.4 %
Platelets: 253 10*3/uL (ref 140–400)
RDW: 13.2 % (ref 11.0–15.0)
Total Lymphocyte: 25.4 %
WBC: 6.5 10*3/uL (ref 3.8–10.8)

## 2020-11-06 LAB — LIPID PANEL
Cholesterol: 206 mg/dL — ABNORMAL HIGH (ref <200)
HDL: 72 mg/dL (ref >=40)
LDL Cholesterol (Calc): 116 mg/dL (calc) — ABNORMAL HIGH
Non-HDL Cholesterol (Calc): 134 mg/dL (calc) — ABNORMAL HIGH (ref <130)
Total CHOL/HDL Ratio: 2.9 (calc) (ref <5.0)
Triglycerides: 84 mg/dL (ref <150)

## 2020-11-06 LAB — COMPLETE METABOLIC PANEL WITH GFR
AG Ratio: 1.7 (calc) (ref 1.0–2.5)
ALT: 21 U/L (ref 9–46)
Albumin: 4.4 g/dL (ref 3.6–5.1)
Alkaline phosphatase (APISO): 43 U/L (ref 36–130)
Chloride: 104 mmol/L (ref 98–110)
GFR, Est Non African American: 81 mL/min/{1.73_m2} (ref >=60)
Glucose, Bld: 105 mg/dL — ABNORMAL HIGH (ref 65–99)
Potassium: 4.5 mmol/L (ref 3.5–5.3)
Sodium: 139 mmol/L (ref 135–146)

## 2020-11-06 LAB — MICROALBUMIN, URINE: Microalb, Ur: 0.5 mg/dL

## 2020-11-06 LAB — HEMOGLOBIN A1C
Hgb A1c MFr Bld: 6.3 % of total Hgb — ABNORMAL HIGH (ref <5.7)
Mean Plasma Glucose: 134 mg/dL
eAG (mmol/L): 7.4 mmol/L

## 2020-11-12 ENCOUNTER — Other Ambulatory Visit: Payer: Self-pay | Admitting: *Deleted

## 2020-11-12 DIAGNOSIS — E785 Hyperlipidemia, unspecified: Secondary | ICD-10-CM

## 2020-11-12 DIAGNOSIS — E1165 Type 2 diabetes mellitus with hyperglycemia: Secondary | ICD-10-CM

## 2020-11-12 MED ORDER — ROSUVASTATIN CALCIUM 5 MG PO TABS: 5.0000 mg | ORAL_TABLET | Freq: Every day | ORAL | 3 refills | Status: AC

## 2021-01-05 ENCOUNTER — Encounter: Payer: 59 | Admitting: Nurse Practitioner

## 2022-02-11 ENCOUNTER — Ambulatory Visit: Payer: Self-pay | Admitting: Family Medicine

## 2022-02-25 ENCOUNTER — Ambulatory Visit (INDEPENDENT_AMBULATORY_CARE_PROVIDER_SITE_OTHER): Payer: 59 | Admitting: Family Medicine

## 2022-02-25 VITALS — BP 120/80 | HR 79 | Temp 98.8°F | Ht 71.0 in | Wt 271.4 lb

## 2022-02-25 DIAGNOSIS — G8929 Other chronic pain: Secondary | ICD-10-CM | POA: Diagnosis not present

## 2022-02-25 DIAGNOSIS — E785 Hyperlipidemia, unspecified: Secondary | ICD-10-CM

## 2022-02-25 DIAGNOSIS — M25571 Pain in right ankle and joints of right foot: Secondary | ICD-10-CM | POA: Diagnosis not present

## 2022-02-25 DIAGNOSIS — E1165 Type 2 diabetes mellitus with hyperglycemia: Secondary | ICD-10-CM | POA: Diagnosis not present

## 2022-02-25 MED ORDER — MELOXICAM 15 MG PO TABS: 15.0000 mg | ORAL_TABLET | Freq: Every day | ORAL | 5 refills | Status: AC

## 2022-02-25 NOTE — Progress Notes (Signed)
Subjective:    Patient ID: Barry Brunner, male    DOB: 04-18-1986, 36 y.o.   MRN: [id number]  HPI  Patient is a very pleasant 36 year old African-American gentleman who presents today with chronic pain and irritation in his right ankle.  He suffered an ankle fracture in the past.  He underwent ORIF of the right ankle at an outside hospital.  He reports pain and stiffness in the right ankle.  He states that if he sits for a long period of time, and he tries to stand up, it is very hard for him to walk.  It is hard for him to dorsiflex or plantarflex his ankle.  He has pain with inversion and eversion.  Generally he is getting stiffness and pain if he is on his feet for a long period of time.  He saw orthopedics last year who did an x-ray and per his report there was a small amount of arthritis in the ankle.  He states that they put him on steroids for short period of time.  However this is a chronic pain.  He has not had a cortisone injection.  Today on examination, he has excellent pulses in his right foot.  There is a scar on the anterior surface of the right ankle.  He does have diminished dorsiflexion limited to about 15 degrees and diminished plantarflexion limited to 30 degrees.  He has diminished inversion and eversion.  There is no erythema or warmth or effusion Past Medical History:  Diagnosis Date   Diabetes mellitus without complication (HCC)    Hyperlipidemia    Past Surgical History:  Procedure Laterality Date   ANKLE FRACTURE SURGERY  2016   right, [facility] in [place], [place]   Current Outpatient Medications on File Prior to Visit  Medication Sig Dispense Refill   metFORMIN (GLUCOPHAGE) 500 MG tablet Take 1 tablet (500 mg total) by mouth 2 (two) times daily with a meal. 180 tablet 3   rosuvastatin (CRESTOR) 5 MG tablet Take 1 tablet (5 mg total) by mouth daily. 90 tablet 3   No current facility-administered medications on file prior to visit.   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   Some exercise, just started going to [facility].  03/2018   Social Determinants of Health   Financial Resource Strain: Not on file  Food Insecurity: Not on file  Transportation Needs: Not on file  Physical Activity: Not on file  Stress: Not on file  Social Connections: Not on file  Intimate Partner Violence: Not on file      Review of Systems  All other systems reviewed and are negative.      Objective:   Physical Exam Vitals reviewed.  Constitutional:      General: He is not in acute distress.  Appearance: Normal appearance. He is not ill-appearing, toxic-appearing or diaphoretic.  Cardiovascular:     Rate and Rhythm: Normal rate and regular rhythm.     Pulses: Normal pulses.     Heart sounds: Normal heart sounds. No murmur heard.    No friction rub. No gallop.  Pulmonary:     Effort: Pulmonary effort is normal. No respiratory distress.     Breath sounds: Normal breath sounds. No wheezing, rhonchi or rales.  Abdominal:     General: Bowel sounds are normal. There is no distension.     Palpations: Abdomen is soft.     Tenderness: There is no abdominal tenderness. There is no guarding.  Musculoskeletal:     Right lower leg: No edema.     Left lower leg: No edema.     Right ankle: No swelling, deformity or ecchymosis. Decreased range of motion. Anterior drawer test negative.     Right Achilles Tendon: Normal.     Right foot:  Normal range of motion. No swelling, deformity, tenderness, bony tenderness or crepitus.  Neurological:     Mental Status: He is alert.         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  We will try meloxicam 15 mg daily to see if this helps.  Patient inquired about a handicap placard however at the present time I do not feel that that is medically appropriate.  I will check a CBC CMP lipid panel A1c and urine microalbumin while the patient is here today to monitor the management of his diabetes.  I cautioned the patient about GI toxicity and renal toxicity due to long-term NSAID use.  Patient continues to have significant pain in his right ankle, would recommend repeat imaging of the right ankle as I am assuming that this is osteoarthritis based on his exam

## 2022-02-26 LAB — CBC WITH DIFFERENTIAL/PLATELET
Absolute Monocytes: 482 cells/uL (ref 200–950)
Basophils Absolute: 12 cells/uL (ref 0–200)
Basophils Relative: 0.2 %
Eosinophils Absolute: 110 cells/uL (ref 15–500)
Eosinophils Relative: 1.8 %
HCT: 44.7 % (ref 38.5–50.0)
Hemoglobin: 14 g/dL (ref 13.2–17.1)
Lymphs Abs: 1556 cells/uL (ref 850–3900)
MCH: 24.2 pg — ABNORMAL LOW (ref 27.0–33.0)
MCHC: 31.3 g/dL — ABNORMAL LOW (ref 32.0–36.0)
MCV: 77.3 fL — ABNORMAL LOW (ref 80.0–100.0)
MPV: 10.8 fL (ref 7.5–12.5)
Monocytes Relative: 7.9 %
Neutro Abs: 3941 cells/uL (ref 1500–7800)
Neutrophils Relative %: 64.6 %
Platelets: 253 10*3/uL (ref 140–400)
RBC: 5.78 10*6/uL (ref 4.20–5.80)
RDW: 13.4 % (ref 11.0–15.0)
Total Lymphocyte: 25.5 %
WBC: 6.1 10*3/uL (ref 3.8–10.8)

## 2022-02-26 LAB — COMPLETE METABOLIC PANEL WITH GFR
AG Ratio: 1.5 (calc) (ref 1.0–2.5)
ALT: 21 U/L (ref 9–46)
AST: 19 U/L (ref 10–40)
Albumin: 4.2 g/dL (ref 3.6–5.1)
Alkaline phosphatase (APISO): 46 U/L (ref 36–130)
BUN/Creatinine Ratio: 12 (calc) (ref 6–22)
BUN: 16 mg/dL (ref 7–25)
CO2: 28 mmol/L (ref 20–32)
Calcium: 9.4 mg/dL (ref 8.6–10.3)
Chloride: 104 mmol/L (ref 98–110)
Creat: 1.29 mg/dL — ABNORMAL HIGH (ref 0.60–1.26)
Globulin: 2.8 g/dL (calc) (ref 1.9–3.7)
Glucose, Bld: 122 mg/dL — ABNORMAL HIGH (ref 65–99)
Potassium: 4.5 mmol/L (ref 3.5–5.3)
Sodium: 139 mmol/L (ref 135–146)
Total Bilirubin: 0.4 mg/dL (ref 0.2–1.2)
Total Protein: 7 g/dL (ref 6.1–8.1)
eGFR: 74 mL/min/{1.73_m2} (ref >=60)

## 2022-02-26 LAB — HEMOGLOBIN A1C
Hgb A1c MFr Bld: 6.4 % of total Hgb — ABNORMAL HIGH (ref <5.7)
Mean Plasma Glucose: 137 mg/dL
eAG (mmol/L): 7.6 mmol/L

## 2022-02-26 LAB — LIPID PANEL
Cholesterol: 203 mg/dL — ABNORMAL HIGH (ref <200)
HDL: 64 mg/dL (ref >=40)
LDL Cholesterol (Calc): 120 mg/dL (calc) — ABNORMAL HIGH
Non-HDL Cholesterol (Calc): 139 mg/dL (calc) — ABNORMAL HIGH (ref <130)
Total CHOL/HDL Ratio: 3.2 (calc) (ref <5.0)
Triglycerides: 90 mg/dL (ref <150)

## 2022-02-26 LAB — MICROALBUMIN, URINE: Microalb, Ur: 0.2 mg/dL

## 2023-02-10 ENCOUNTER — Emergency Department (HOSPITAL_BASED_OUTPATIENT_CLINIC_OR_DEPARTMENT_OTHER): Payer: No Typology Code available for payment source

## 2023-02-10 ENCOUNTER — Emergency Department (HOSPITAL_BASED_OUTPATIENT_CLINIC_OR_DEPARTMENT_OTHER)
Admission: EM | Admit: 2023-02-10 | Discharge: 2023-02-10 | Disposition: A | Discharge status: Home / Self Care | Payer: No Typology Code available for payment source | Attending: Emergency Medicine | Admitting: Emergency Medicine

## 2023-02-10 ENCOUNTER — Encounter (HOSPITAL_BASED_OUTPATIENT_CLINIC_OR_DEPARTMENT_OTHER): Payer: Self-pay

## 2023-02-10 ENCOUNTER — Other Ambulatory Visit: Payer: Self-pay

## 2023-02-10 DIAGNOSIS — Y92410 Unspecified street and highway as the place of occurrence of the external cause: Secondary | ICD-10-CM | POA: Insufficient documentation

## 2023-02-10 DIAGNOSIS — S80811A Abrasion, right lower leg, initial encounter: Secondary | ICD-10-CM | POA: Diagnosis not present

## 2023-02-10 DIAGNOSIS — S80812A Abrasion, left lower leg, initial encounter: Secondary | ICD-10-CM | POA: Insufficient documentation

## 2023-02-10 DIAGNOSIS — S40812A Abrasion of left upper arm, initial encounter: Secondary | ICD-10-CM | POA: Insufficient documentation

## 2023-02-10 DIAGNOSIS — Z23 Encounter for immunization: Secondary | ICD-10-CM | POA: Insufficient documentation

## 2023-02-10 DIAGNOSIS — S40811A Abrasion of right upper arm, initial encounter: Secondary | ICD-10-CM | POA: Insufficient documentation

## 2023-02-10 DIAGNOSIS — S6991XA Unspecified injury of right wrist, hand and finger(s), initial encounter: Secondary | ICD-10-CM | POA: Diagnosis present

## 2023-02-10 DIAGNOSIS — S63650A Sprain of metacarpophalangeal joint of right index finger, initial encounter: Secondary | ICD-10-CM | POA: Insufficient documentation

## 2023-02-10 DIAGNOSIS — Z7984 Long term (current) use of oral hypoglycemic drugs: Secondary | ICD-10-CM | POA: Insufficient documentation

## 2023-02-10 MED ORDER — TETANUS-DIPHTH-ACELL PERTUSSIS 5-2.5-18.5 LF-MCG/0.5 IM SUSY
0.5000 mL | PREFILLED_SYRINGE | Freq: Once | INTRAMUSCULAR | Status: AC
  Administered 2023-02-10: 0.5 mL via INTRAMUSCULAR
  Filled 2023-02-10: qty 0.5
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
## 2023-09-22 ENCOUNTER — Telehealth: Payer: Self-pay

## 2023-09-22 ENCOUNTER — Other Ambulatory Visit: Payer: No Typology Code available for payment source

## 2023-09-22 DIAGNOSIS — E1165 Type 2 diabetes mellitus with hyperglycemia: Secondary | ICD-10-CM

## 2023-09-22 DIAGNOSIS — E785 Hyperlipidemia, unspecified: Secondary | ICD-10-CM

## 2023-09-22 NOTE — Telephone Encounter (Signed)
 Copied from CRM [id number]. 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

## 2023-09-23 LAB — HEMOGLOBIN A1C
Hgb A1c MFr Bld: 7.1 %{Hb} — ABNORMAL HIGH (ref <5.7)
Mean Plasma Glucose: 157 mg/dL
eAG (mmol/L): 8.7 mmol/L

## 2023-09-25 ENCOUNTER — Ambulatory Visit (INDEPENDENT_AMBULATORY_CARE_PROVIDER_SITE_OTHER): Payer: No Typology Code available for payment source | Admitting: Family Medicine

## 2023-09-25 VITALS — BP 130/82 | HR 75 | Temp 97.9°F | Ht 71.0 in | Wt 279.0 lb

## 2023-09-25 DIAGNOSIS — G4733 Obstructive sleep apnea (adult) (pediatric): Secondary | ICD-10-CM

## 2023-09-25 DIAGNOSIS — E1165 Type 2 diabetes mellitus with hyperglycemia: Secondary | ICD-10-CM

## 2023-09-25 DIAGNOSIS — Z7984 Long term (current) use of oral hypoglycemic drugs: Secondary | ICD-10-CM | POA: Diagnosis not present

## 2023-09-25 NOTE — Progress Notes (Signed)
 Subjective:    Patient ID: Brian Dickson, male    DOB: 12-18-1985, 38 y.o.   MRN: [id number]  HPI  Patient is a very pleasant 38 year old African-American gentleman who presents today for follow up.  Most recent blood work is listed below.  Patient had a sleep study in 2019 that showed moderate to severe obstructive sleep apnea with an apnea-hypopnea index of 26 events per hour.  Patient has not been wearing his CPAP machine.  He has a CDL license and therefore he would like to start wearing CPAP again as it can maintain his occasional he stopped all of his Beatties medication.  His hemoglobin A1c was recently 7.1.  He denies any polyuria polydipsia or blurry vision.  His blood pressure today is well-controlled.  Lab on 09/22/2023  Component Date Value Ref Range Status   Hgb A1c MFr Bld 09/22/2023 7.1 (H)  <5.7 % of total Hgb Final   Comment: For someone without known diabetes, a hemoglobin A1c value of 6.5% or greater indicates that they may have  diabetes and this should be confirmed with a follow-up  test. . For someone with known diabetes, a value <7% indicates  that their diabetes is well controlled and a value  greater than or equal to 7% indicates suboptimal  control. A1c targets should be individualized based on  duration of diabetes, age, comorbid conditions, and  other considerations. . Currently, no consensus exists regarding use of hemoglobin A1c for diagnosis of diabetes for children. .    Mean Plasma Glucose 09/22/2023 157  mg/dL Final   eAG (mmol/L) 98/89/7974 8.7  mmol/L Final    Past Medical History:  Diagnosis Date   Diabetes mellitus without complication (HCC)    Hyperlipidemia    Past Surgical History:  Procedure Laterality Date   ANKLE FRACTURE SURGERY  2016   right, [facility] in [place], [place]   Current Outpatient Medications on File Prior to Visit  Medication Sig Dispense Refill   meloxicam  (MOBIC ) 15 MG tablet Take 1 tablet (15 mg total) by mouth  daily. (Patient not taking: Reported on 09/25/2023) 30 tablet 5   metFORMIN  (GLUCOPHAGE ) 500 MG tablet Take 1 tablet (500 mg total) by mouth 2 (two) times daily with a meal. (Patient not taking: Reported on 09/25/2023) 180 tablet 3   rosuvastatin  (CRESTOR ) 5 MG tablet Take 1 tablet (5 mg total) by mouth daily. (Patient not taking: Reported on 09/25/2023) 90 tablet 3   No current facility-administered medications on file prior to visit.   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   Some exercise, just started going to [facility].  03/2018   Social Drivers of Health   Financial Resource Strain: Not on file  Food Insecurity: Not on file  Transportation Needs: Not on file  Physical Activity: Not on  file  Stress: Not on file  Social Connections: Unknown (01/25/2022)   Received from [facility], [facility]   Social Network    Social Network: Not on file  Intimate Partner Violence: Unknown (12/17/2021)   Received from [facility], [facility]   HITS    Physically Hurt: Not on file    Insult or Talk Down To: Not on file    Threaten Physical Harm: Not on file    Scream or Curse: Not on file      Review of Systems  All other systems reviewed and are negative.      Objective:   Physical Exam Vitals reviewed.  Constitutional:      General: He is not in acute distress.    Appearance: Normal appearance. He is not ill-appearing, toxic-appearing  or diaphoretic.  Cardiovascular:     Rate and Rhythm: Normal rate and regular rhythm.     Pulses: Normal pulses.     Heart sounds: Normal heart sounds. No murmur heard.    No friction rub. No gallop.  Pulmonary:     Effort: Pulmonary effort is normal. No respiratory distress.     Breath sounds: Normal breath sounds. No wheezing, rhonchi or rales.  Musculoskeletal:     Right lower leg: No edema.     Left lower leg: No edema.     Right ankle:     Right Achilles Tendon: Normal.     Right foot: Normal range of motion. No swelling, deformity, tenderness, bony tenderness or crepitus.  Neurological:     Mental Status: He is alert.         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  We are going to get the patient scheduled to receive CPAP at home through a DME company.  Will set him up with auto titration 5-15 cm of water with heated humidifier.  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  His most recent A1c is 7.1.  I recommend resuming metformin  that he would like to work on diet and exercise and loss.  He is drinking 3-4 sodas a day and eating a lot of junk food.  He believes that he can make big changes and hopefully avoid medication.  Recheck lab work in 6 months.  Meanwhile for Dyal we will check a CMP and a urine protein creatinine ratio

## 2023-09-26 LAB — MICROALBUMIN / CREATININE URINE RATIO
Creatinine, Urine: 180 mg/dL (ref 20–320)
Microalb, Ur: <0.2 mg/dL

## 2023-09-26 LAB — LIPID PANEL
Cholesterol: 214 mg/dL — ABNORMAL HIGH (ref <200)
HDL: 70 mg/dL (ref >=40)
LDL Cholesterol (Calc): 129 mg/dL — ABNORMAL HIGH
Non-HDL Cholesterol (Calc): 144 mg/dL — ABNORMAL HIGH (ref <130)
Total CHOL/HDL Ratio: 3.1 (calc) (ref <5.0)
Triglycerides: 63 mg/dL (ref <150)

## 2023-09-26 LAB — COMPLETE METABOLIC PANEL WITH GFR
AG Ratio: 1.6 (calc) (ref 1.0–2.5)
ALT: 28 U/L (ref 9–46)
AST: 21 U/L (ref 10–40)
Albumin: 4.7 g/dL (ref 3.6–5.1)
Alkaline phosphatase (APISO): 46 U/L (ref 36–130)
BUN: 18 mg/dL (ref 7–25)
CO2: 26 mmol/L (ref 20–32)
Calcium: 9.7 mg/dL (ref 8.6–10.3)
Chloride: 103 mmol/L (ref 98–110)
Creat: 1.2 mg/dL (ref 0.60–1.26)
Globulin: 2.9 g/dL (ref 1.9–3.7)
Glucose, Bld: 112 mg/dL — ABNORMAL HIGH (ref 65–99)
Potassium: 4.6 mmol/L (ref 3.5–5.3)
Sodium: 140 mmol/L (ref 135–146)
Total Bilirubin: 0.4 mg/dL (ref 0.2–1.2)
Total Protein: 7.6 g/dL (ref 6.1–8.1)
eGFR: 80 mL/min/{1.73_m2} (ref >=60)

## 2023-12-29 ENCOUNTER — Other Ambulatory Visit: Payer: Self-pay

## 2023-12-29 ENCOUNTER — Telehealth: Payer: Self-pay | Admitting: Family Medicine

## 2023-12-29 DIAGNOSIS — G4733 Obstructive sleep apnea (adult) (pediatric): Secondary | ICD-10-CM

## 2023-12-29 DIAGNOSIS — G4730 Sleep apnea, unspecified: Secondary | ICD-10-CM

## 2023-12-29 NOTE — Telephone Encounter (Signed)
 Patient stopped by office to check on his sleep study referral. I didn't see a referral but at his last office visit it was discussed.  CB# [phone]

## 2024-01-15 ENCOUNTER — Other Ambulatory Visit: Payer: Self-pay

## 2024-01-15 DIAGNOSIS — G4730 Sleep apnea, unspecified: Secondary | ICD-10-CM

## 2024-01-15 DIAGNOSIS — G4733 Obstructive sleep apnea (adult) (pediatric): Secondary | ICD-10-CM

## 2024-02-21 ENCOUNTER — Ambulatory Visit (HOSPITAL_BASED_OUTPATIENT_CLINIC_OR_DEPARTMENT_OTHER): Attending: Family Medicine | Admitting: Internal Medicine

## 2024-02-21 VITALS — Ht 71.0 in | Wt 279.0 lb

## 2024-02-21 DIAGNOSIS — G4733 Obstructive sleep apnea (adult) (pediatric): Secondary | ICD-10-CM | POA: Diagnosis not present

## 2024-02-21 DIAGNOSIS — G4730 Sleep apnea, unspecified: Secondary | ICD-10-CM

## 2024-02-21 DIAGNOSIS — R0683 Snoring: Secondary | ICD-10-CM | POA: Insufficient documentation

## 2024-02-23 ENCOUNTER — Telehealth: Payer: Self-pay | Admitting: Family Medicine

## 2024-02-23 NOTE — Telephone Encounter (Unsigned)
 Copied from CRM [id number]. Topic: Clinical - Lab/Test Results >> Feb 23, 2024 12:51 PM Tiffini S wrote: Reason for CRM: Patient completed a sleep study on 02/21/24. He have receive a home sleep study kit in the mail and have questions about the test. He is asking for a call back at [phone].

## 2024-03-02 DIAGNOSIS — G4733 Obstructive sleep apnea (adult) (pediatric): Secondary | ICD-10-CM

## 2024-03-02 DIAGNOSIS — G4730 Sleep apnea, unspecified: Secondary | ICD-10-CM

## 2024-03-02 NOTE — Procedures (Signed)
 Darryle Law [facility] [address] [place], [place] [address] Tel: [phone]   Fax: [phone]  Home Sleep Test Interpretation  Patient Name: Brian Dickson, Brian Dickson Date: 02/21/2024  Date of Birth: 19-Aug-1986 Study Type: HST  Age: 38 year MRN #: [id number]  Sex: Male Interpreting Physician: NEYSA RAMA [id number]  Height: 5' 11 Referring Physician: Dr. Butler Burr  Weight: 279.0 lbs Recording Tech: Will Poet RRT RPSGT RST  BMI: 39.1 Scoring Tech: Will Poet RRT RPSGT RST  ESS: 2 Neck Size: 17   Indications for Polysomnography The patient is a 38 year-old Male who is 5' 11 and weighs 279.0 lbs. His BMI equals 39.1.  A home sleep apnea test was performed to evaluate for -.OSA  Medication  No Data.   Polysomnogram Data A home sleep test recorded the standard physiologic parameters including EKG, nasal and oral airflow.  Respiratory parameters of chest and abdominal movements were recorded with Respiratory Inductance Plethysmography belts.  Oxygen saturation was recorded by pulse oximetry.   Study Architecture The total recording time of the polysomnogram was 371.4 minutes.  The total monitoring time was 371.5 minutes.  Time spent in Supine position was 274.5 minutes.   Respiratory Events The study revealed a presence of 431 obstructive, - central, and 3 mixed apneas resulting in an Apnea index of 70.1 events per hour.  There were 86 hypopneas (>=3% desaturation and/or arousal) resulting in an Apnea\Hypopnea Index (AHI >=3% desaturation and/or arousal) of 84.0 events per hour.  There were 72 hypopneas (>=4% desaturation) resulting in an Apnea\Hypopnea Index (AHI >=4% desaturation) of 81.7 events per hour.  There were - Respiratory Effort Related Arousals resulting in a RERA index of - events per hour. The Respiratory Disturbance Index is 84.0 events per hour.  The snore index was - events per hour.  Mean oxygen saturation was 94.6%.   The lowest oxygen saturation during monitoring time was 79.0%.  Time spent <=88% oxygen saturation was 40.5 minutes (10.9%).  Cardiac Summary The average pulse rate was 71.7 bpm.  The minimum pulse rate was 56.0 bpm while the maximum pulse rate was 100.0 bpm.  Cardiac rhythm was normal  Comment: Severe obstructive sleep apnea, AHI (3%) 84/hr. Snoring with oxygen desaturation to a nadir of 79%, mean 94.6%.  Diagnosis: Obstructive sleep apnea  Recommendations: Autopap, CPAP titrati0n sleep study or ENT evaluation for surgical options   This study was personally reviewed and electronically signed by: Dr. RAMA JONETTA NEYSA Accredited Board Certified in Sleep Medicine Date/Time: 03/02/24  11:55   Study Overview  Recording Time: 427.3 min. Monitoring Time: 371.5 min.  Analysis Start:  10:35:38 PM Supine Time: 274.5 min.  Analysis Stop:  04:47:01 AM     Study Summary   Count Index Longest Event Duration  Apneas & Hypopneas: 520 84.0  Apneas: 97.5 sec.     Hypopneas: 123.8 sec.  RERAs: - - - sec.  Desaturations: 498 80.4 126.8 sec.  Snores: - - - sec.    Minimum Oxygen Saturation: 79.0%    Respiratory Summary   Total Duration Supine Non-Supine   Count Index Average Longest Count Index Count Index  Obstructive Apnea 431 69.6 23.0 97.5 295 64.5 136 84.1   Mixed Apnea 3 0.5 24.5 27.6 3 0.7 - -   Central Apnea - - - - - - - -   Total Apneas 434 70.1 23.0 97.5 298 65.1 136 84.1  Hypopneas 3% 86 13.9 N.A. N.A. 75 16.4 11 6.8   Apneas & Hyp. 3% 520 84.0 N.A. N.A. 373 81.5 147 90.9            Hypopneas 4% 72 11.6 N.A. N.A. 64 14.0 8 4.9  Apneas & Hyp. 4% 506 81.7 N.A. N.A. 362 79.1 144 89.1             RERAs - - - - - - - -  RDI 521 84.1 N.A. N.A. 374 81.7 147 90.9   Oxygen Saturation Summary   Total Supine Non-Supine  Average SpO2 94.6% 94.9% 93.9%  Minimum SpO2 79.0% 80.0% 79.0%   Maximum SpO2 100.0% 100.0% 99.0%   Oxygen Saturation Distribution  Range (%) Time  in range (min) Time in range (%)  90.0 - 100.0 303.8 81.8%  80.0 - 90.0 67.5 18.2%  70.0 - 80.0 0.3 0.1%  60.0 - 70.0 - -  50.0 - 60.0 - -  0.0 - 50.0 - -  Time Spent <=88% SpO2  Range (%) Time in range (min) Time in range (%)  0.0 - 88.0 40.5 10.9%  Cardiac Summary   Total Supine Non-Supine  Average Pulse Rate (BPM) 71.7 71.6 71.9  Minimum Pulse Rate (BPM) 56.0 56.0 62.0  Maximum Pulse Rate (BPM) 100.0 100.0 99.0                      Technologist Comments  -                         Brian Dickson, Biomedical engineer of Sleep Medicine  ELECTRONICALLY SIGNED ON:  03/02/2024, 11:50 AM [facility] PH: [phone]   FX: [phone] ACCREDITED BY THE [employer]

## 2024-03-04 ENCOUNTER — Encounter: Payer: Self-pay | Admitting: Family Medicine

## 2024-03-05 NOTE — Telephone Encounter (Signed)
 Placed rx on Dr Breck desk for him to sign.
